# Patient Record
Sex: Male | Born: 1955 | Race: White | Hispanic: No | Smoking: Former smoker
Health system: Southern US, Community
[De-identification: ages and names within clinical notes are randomized; demographics above are authoritative.]

## PROBLEM LIST (undated history)

## (undated) DIAGNOSIS — M1990 Unspecified osteoarthritis, unspecified site: Secondary | ICD-10-CM

## (undated) DIAGNOSIS — I10 Essential (primary) hypertension: Secondary | ICD-10-CM

---

## 2015-10-02 ENCOUNTER — Emergency Department
Admission: EM | Admit: 2015-10-02 | Discharge: 2015-10-02 | Disposition: A | Discharge status: Home / Self Care | Payer: BLUE CROSS/BLUE SHIELD | Source: Home / Self Care | Attending: Emergency Medicine | Admitting: Emergency Medicine

## 2015-10-02 ENCOUNTER — Emergency Department (INDEPENDENT_AMBULATORY_CARE_PROVIDER_SITE_OTHER): Payer: BLUE CROSS/BLUE SHIELD

## 2015-10-02 ENCOUNTER — Encounter: Payer: Self-pay | Admitting: Emergency Medicine

## 2015-10-02 DIAGNOSIS — M479 Spondylosis, unspecified: Secondary | ICD-10-CM

## 2015-10-02 DIAGNOSIS — M545 Low back pain: Secondary | ICD-10-CM

## 2015-10-02 HISTORY — DX: Unspecified osteoarthritis, unspecified site: M19.90

## 2015-10-02 HISTORY — DX: Essential (primary) hypertension: I10

## 2015-10-02 MED ORDER — PREDNISONE 10 MG PO TABS: ORAL_TABLET | ORAL | Status: DC

## 2015-10-02 MED ORDER — METHOCARBAMOL 500 MG PO TABS: 500.0000 mg | ORAL_TABLET | Freq: Four times a day (QID) | ORAL | Status: DC

## 2015-10-02 MED ORDER — HYDROCODONE-ACETAMINOPHEN 5-325 MG PO TABS: 2.0000 | ORAL_TABLET | ORAL | Status: DC | PRN

## 2015-10-02 NOTE — ED Notes (Signed)
Low back pain x 2 weeks after doing heavy lifting in the yard. Pain when going from sitting to standing position. 8/10, not getting better

## 2015-10-02 NOTE — Discharge Instructions (Signed)

## 2015-10-02 NOTE — ED Provider Notes (Signed)
CSN: [id number]     Arrival date & time 10/02/15  4098 History   First MD Initiated Contact with Patient 10/02/15 1006     Chief Complaint  Patient presents with  . Back Pain   (Consider location/radiation/quality/duration/timing/severity/associated sxs/prior Treatment) Patient is a 60 y.o. male presenting with back pain. The history is provided by the patient. No language interpreter was used.  Back Pain Location:  Lumbar spine Quality:  Aching Radiates to:  Does not radiate Pain severity:  Severe Pain is:  Same all the time Onset quality:  Sudden Timing:  Constant Progression:  Worsening Chronicity:  New Context: lifting heavy objects   Relieved by:  Nothing Worsened by:  Nothing tried Ineffective treatments:  None tried Associated symptoms: no numbness and no paresthesias   Risk factors: no lack of exercise   Pt reports sevee pain when he rises from a seated position.   Past Medical History  Diagnosis Date  . Arthritis   . Hypertension    History reviewed. No pertinent past surgical history. Family History  Problem Relation Age of Onset  . Hypertension Mother   . Hypertension Father    Social History  Substance Use Topics  . Smoking status: Former Games developer  . Smokeless tobacco: None  . Alcohol Use: No    Review of Systems  Musculoskeletal: Positive for back pain.  Neurological: Negative for numbness and paresthesias.  All other systems reviewed and are negative.   Allergies  Review of patient's allergies indicates no known allergies.  Home Medications   Prior to Admission medications   Medication Sig Start Date End Date Taking? Authorizing Provider  albuterol (PROVENTIL) (2.5 MG/3ML) 0.083% nebulizer solution Take 2.5 mg by nebulization every 6 (six) hours as needed for wheezing or shortness of breath.   Yes Historical Provider, MD  amLODipine-benazepril (LOTREL) 5-10 MG capsule Take 1 capsule by mouth daily.   Yes Historical Provider, MD  doxazosin  (CARDURA) 1 MG tablet Take 1 mg by mouth daily.   Yes Historical Provider, MD   Meds Ordered and Administered this Visit  Medications - No data to display  BP 148/83 mmHg  Pulse 68  Temp(Src) 98.3 F (36.8 C) (Oral)  Ht  (1.854 m)  Wt 188 lb (85.276 kg)  BMI 24.81 kg/m2  SpO2 97% No data found.   Physical Exam  Constitutional: He is oriented to person, place, and time. He appears well-developed and well-nourished.  HENT:  Head: Normocephalic and atraumatic.  Eyes: Conjunctivae and EOM are normal.  Neck: Normal range of motion.  Cardiovascular: Normal heart sounds.   Pulmonary/Chest: Effort normal.  Abdominal: He exhibits no distension.  Musculoskeletal: He exhibits tenderness.  Tender ls spine,  Pain with standing,  nv and ns intact  Neurological: He is alert and oriented to person, place, and time.  Skin: Skin is warm.  Psychiatric: He has a normal mood and affect.  Nursing note and vitals reviewed.   ED Course  Procedures (including critical care time)  Labs Review Labs Reviewed - No data to display  Imaging Review Dg Lumbar Spine Complete  10/02/2015  CLINICAL DATA:  Mid and low back pain after working in the garden this past weekend. Initial encounter. EXAM: LUMBAR SPINE - COMPLETE 4+ VIEW COMPARISON:  None. FINDINGS: Vertebral body height and alignment are maintained. Loss of disc space height is seen at L3-4, L4-5 and L5-S1. There is lower lumbar facet degenerative disease. Aortic atherosclerosis is noted. IMPRESSION: No acute abnormality. Lower lumbar spondylosis.  Electronically Signed   By: Drusilla Kannerhomas  Dalessio M.D.   On: 10/02/2015 10:54     Visual Acuity Review  Right Eye Distance:   Left Eye Distance:   Bilateral Distance:    Right Eye Near:   Left Eye Near:    Bilateral Near:         MDM  Pt counseled on results,  I will try prednisone.  Pt referred to Dr. Denyse Amassorey if symptoms perist after prednisone tratment.   1. Low back pain without sciatica,  unspecified back pain laterality    Meds ordered this encounter  Medications  . amLODipine-benazepril (LOTREL) 5-10 MG capsule    Sig: Take 1 capsule by mouth daily.  Marland Kitchen. doxazosin (CARDURA) 1 MG tablet    Sig: Take 1 mg by mouth daily.  Marland Kitchen. albuterol (PROVENTIL) (2.5 MG/3ML) 0.083% nebulizer solution    Sig: Take 2.5 mg by nebulization every 6 (six) hours as needed for wheezing or shortness of breath.  . DISCONTD: predniSONE (DELTASONE) 10 MG tablet    Sig: 6,5,4,3,2,1 taper    Dispense:  21 tablet    Refill:  0    Order Specific Question:  Supervising Provider    AnswerGeorgina Pillion:  MASSEY, DAVID [[id number]]  . HYDROcodone-acetaminophen (NORCO/VICODIN) 5-325 MG tablet    Sig: Take 2 tablets by mouth every 4 (four) hours as needed.    Dispense:  10 tablet    Refill:  0    Order Specific Question:  Supervising Provider    Answer:  Georgina PillionMASSEY, DAVID [[id number]]  . DISCONTD: methocarbamol (ROBAXIN) 500 MG tablet    Sig: Take 1 tablet (500 mg total) by mouth 4 (four) times daily.    Dispense:  28 tablet    Refill:  0    Order Specific Question:  Supervising Provider    Answer:  Georgina PillionMASSEY, DAVID [[id number]]  . methocarbamol (ROBAXIN) 500 MG tablet    Sig: Take 1 tablet (500 mg total) by mouth 4 (four) times daily.    Dispense:  28 tablet    Refill:  0    Order Specific Question:  Supervising Provider    Answer:  Georgina PillionMASSEY, DAVID [[id number]]  . predniSONE (DELTASONE) 10 MG tablet    Sig: 6,5,4,3,2,1 taper    Dispense:  21 tablet    Refill:  0    Order Specific Question:  Supervising Provider    Answer:  Georgina PillionMASSEY, DAVID [[id number]]  An After Visit Summary was printed and given to the patient.    Lonia SkinnerLeslie K CalvertonSofia, PA-C 10/02/15 1118

## 2017-05-25 IMAGING — DX DG LUMBAR SPINE COMPLETE 4+V
5 series · 5 of 5 positions shown · non-contrast
Comparison: None.

CLINICAL DATA: Mid and low back pain after working in the garden
this past weekend. Initial encounter.

EXAM:
LUMBAR SPINE - COMPLETE 4+ VIEW

[l-spine ap]
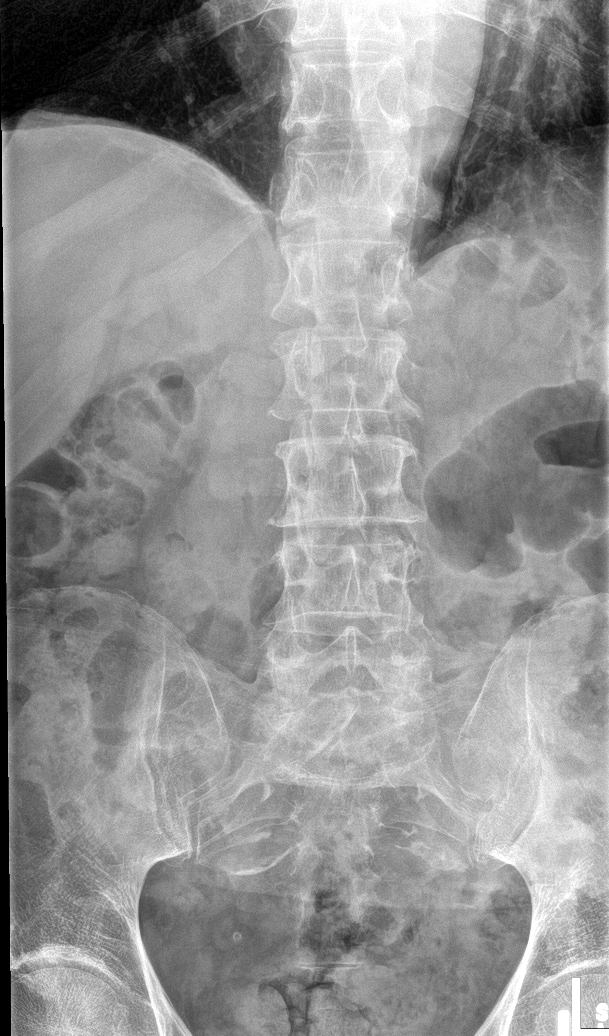

[l-spine obl (1 of 2)]
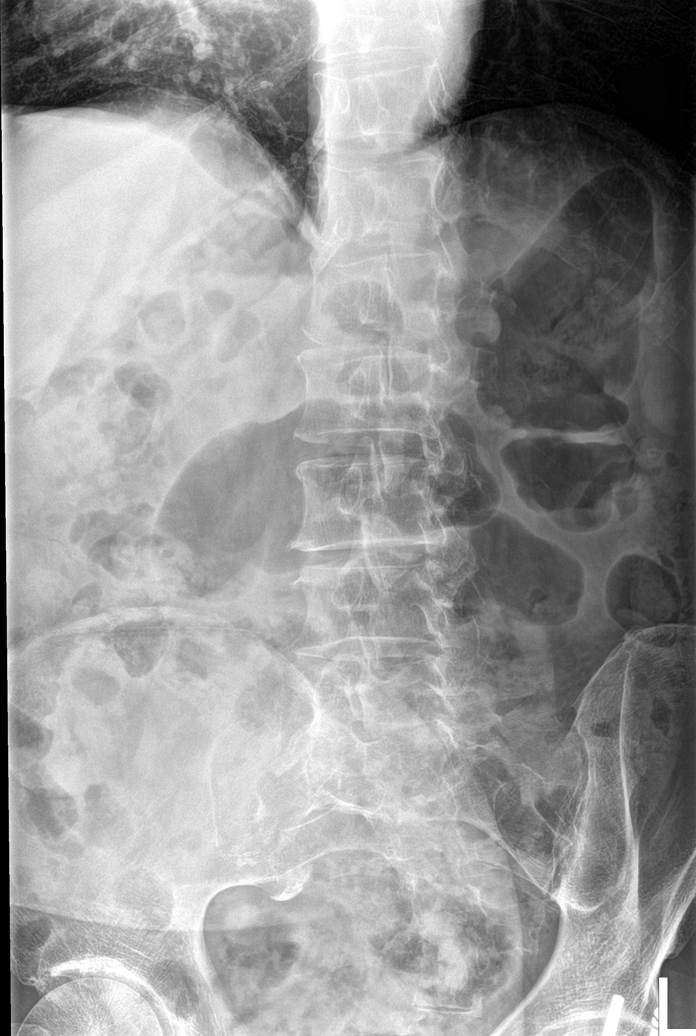

[l-spine obl (2 of 2)]
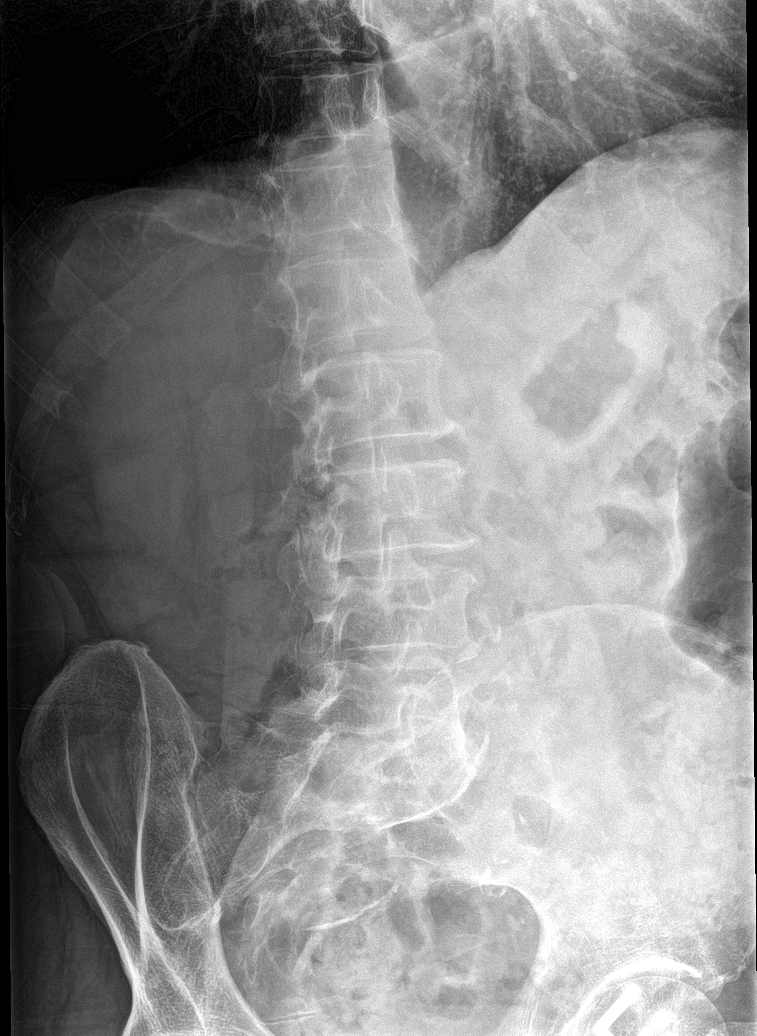

[l-spine lat]
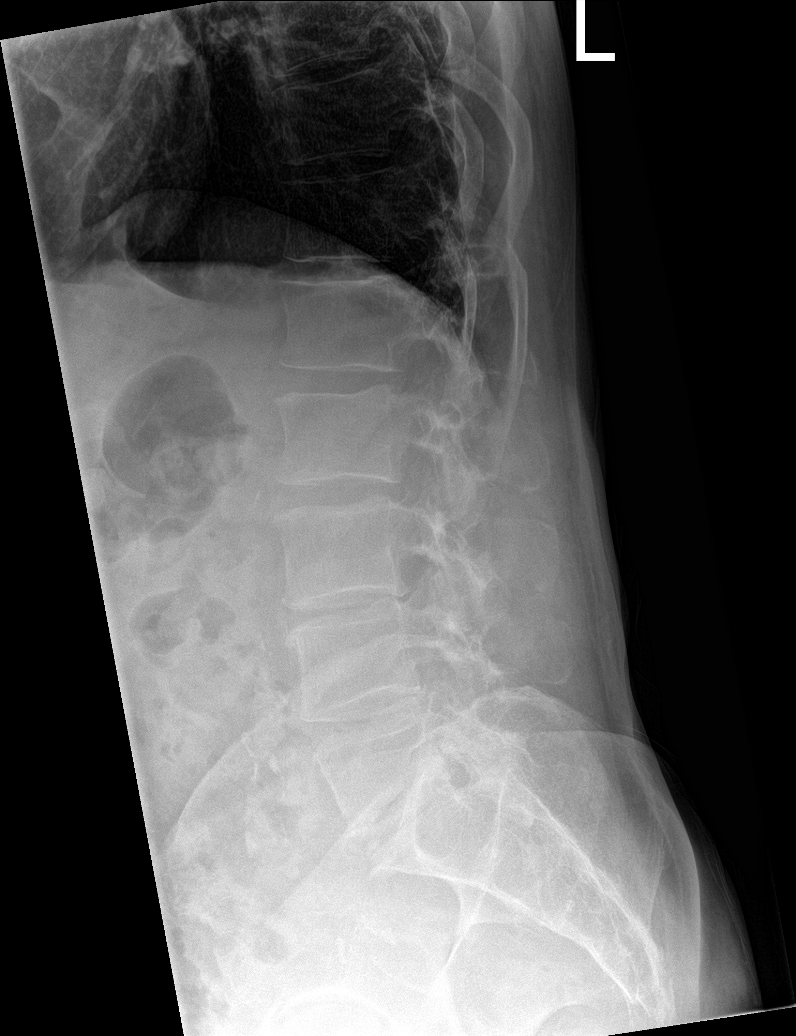

[l-spine spot]
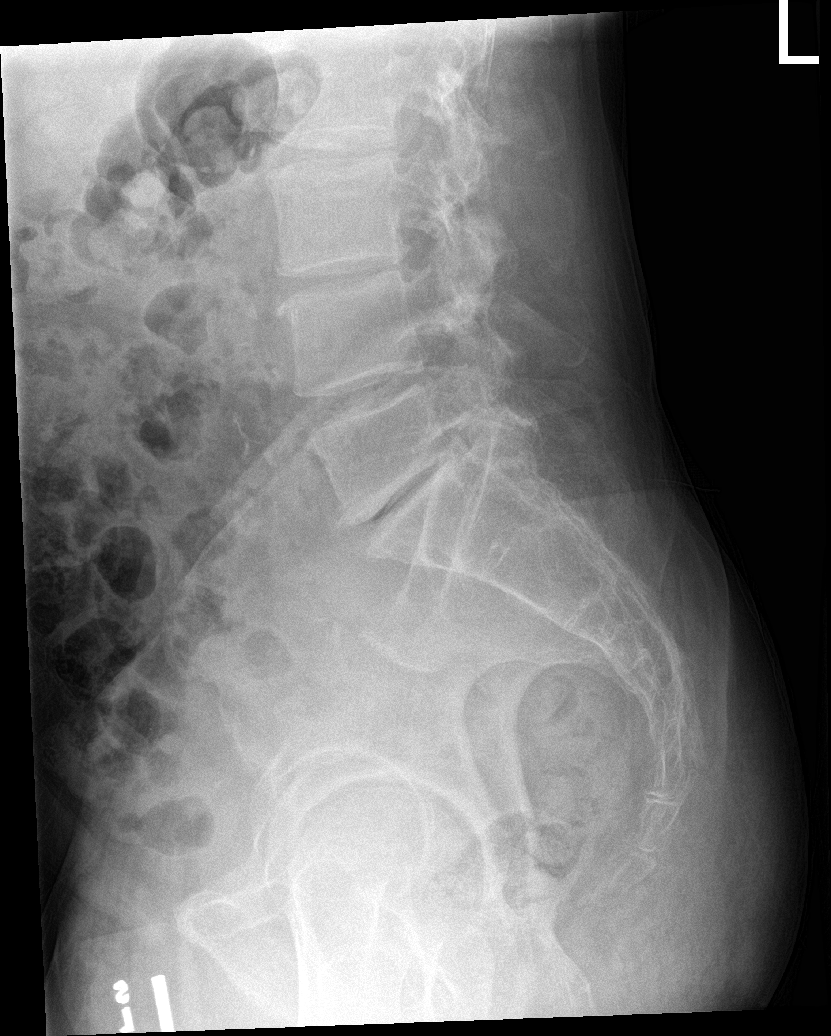

[5 of 5 positions shown; findings below may reference images not displayed]

FINDINGS: Vertebral body height and alignment are maintained. Loss of disc
space height is seen at L3-4, L4-5 and L5-S1. There is lower lumbar
facet degenerative disease. Aortic atherosclerosis is noted.
IMPRESSION: No acute abnormality.

Lower lumbar spondylosis.

## 2019-06-30 ENCOUNTER — Other Ambulatory Visit: Payer: Self-pay

## 2019-06-30 ENCOUNTER — Emergency Department
Admission: EM | Admit: 2019-06-30 | Discharge: 2019-06-30 | Disposition: A | Discharge status: Home / Self Care | Payer: BLUE CROSS/BLUE SHIELD | Source: Home / Self Care

## 2019-06-30 DIAGNOSIS — R509 Fever, unspecified: Secondary | ICD-10-CM

## 2019-06-30 DIAGNOSIS — J069 Acute upper respiratory infection, unspecified: Secondary | ICD-10-CM

## 2019-06-30 MED ORDER — AMLODIPINE BESY-BENAZEPRIL HCL 5-10 MG PO CAPS: 1.0000 | ORAL_CAPSULE | Freq: Every day | ORAL | 0 refills | Status: AC

## 2019-06-30 MED ORDER — DOXAZOSIN MESYLATE 1 MG PO TABS: 1.0000 mg | ORAL_TABLET | Freq: Every day | ORAL | 0 refills | Status: AC

## 2019-06-30 NOTE — ED Triage Notes (Addendum)
Pt c/o bodyaches, fever and sinus drainage since Monday. Works at [employer] and has been around [facility]. Had a neg rapid yesterday at work. Would like sendout today. Taking sinutabs prn. Pt is also hypertensive today. Says he hasn't seen PCP in a few years, and used to take BP meds but does not anymore.

## 2019-06-30 NOTE — ED Provider Notes (Signed)
[facility]    CSN: [id number] Arrival date & time: 06/30/19  1135      History   Chief Complaint Chief Complaint  Patient presents with  . Generalized Body Aches  . Fever  . Nasal Congestion    HPI Lonnie Webster is a 64 y.o. male.   The history is provided by the patient. No language interpreter was used.  Fever Temp source:  Subjective Severity:  Moderate Onset quality:  Gradual Timing:  Constant Progression:  Worsening Chronicity:  New Worsened by:  Nothing Ineffective treatments:  None tried Associated symptoms: cough   Risk factors: no sick contacts   Cough Associated symptoms: fever     Past Medical History:  Diagnosis Date  . Arthritis   . Hypertension     There are no problems to display for this patient.   History reviewed. No pertinent surgical history.     Home Medications    Prior to Admission medications   Medication Sig Start Date End Date Taking? Authorizing Provider  albuterol (PROVENTIL) (2.5 MG/3ML) 0.083% nebulizer solution Take 2.5 mg by nebulization every 6 (six) hours as needed for wheezing or shortness of breath.    [provider]  amLODipine-benazepril (LOTREL) 5-10 MG capsule Take 1 capsule by mouth daily. 06/30/19   Elson Areas, PA-C  doxazosin (CARDURA) 1 MG tablet Take 1 tablet (1 mg total) by mouth daily. 06/30/19   Elson Areas, PA-C    Family History Family History  Problem Relation Age of Onset  . Hypertension Mother   . Hypertension Father     Social History Social History   Tobacco Use  . Smoking status: Former Games developer  . Smokeless tobacco: Never Used  Substance Use Topics  . Alcohol use: No  . Drug use: Not on file     Allergies   Patient has no known allergies.   Review of Systems Review of Systems  Constitutional: Positive for fever.  Respiratory: Positive for cough.   All other systems reviewed and are negative.    Physical Exam Triage Vital Signs ED Triage  Vitals  Enc Vitals Group     BP 06/30/19 1156 (!) 163/111     Pulse Rate 06/30/19 1156 95     Resp 06/30/19 1156 18     Temp 06/30/19 1156 98.7 F (37.1 C)     Temp Source 06/30/19 1156 Oral     SpO2 06/30/19 1156 97 %     Weight 06/30/19 1157 195 lb (88.5 kg)     Height 06/30/19 1157 6' (1.829 m)     Head Circumference --      Peak Flow --      Pain Score 06/30/19 1157 0     Pain Loc --      Pain Edu? --      Excl. in GC? --    No data found.  Updated Vital Signs BP (!) 163/111 (BP Location: Left Arm)   Pulse 95   Temp 98.7 F (37.1 C) (Oral)   Resp 18   Ht 6' (1.829 m)   Wt 88.5 kg   SpO2 97%   BMI 26.45 kg/m   Visual Acuity Right Eye Distance:   Left Eye Distance:   Bilateral Distance:    Right Eye Near:   Left Eye Near:    Bilateral Near:     Physical Exam Vitals and nursing note reviewed.  Constitutional:      Appearance: He is well-developed.  HENT:  Head: Normocephalic and atraumatic.  Eyes:     Conjunctiva/sclera: Conjunctivae normal.  Cardiovascular:     Rate and Rhythm: Normal rate and regular rhythm.     Heart sounds: No murmur.  Pulmonary:     Effort: Pulmonary effort is normal. No respiratory distress.     Breath sounds: Normal breath sounds.  Abdominal:     Palpations: Abdomen is soft.     Tenderness: There is no abdominal tenderness.  Musculoskeletal:     Cervical back: Neck supple.  Skin:    General: Skin is warm and dry.  Neurological:     General: No focal deficit present.     Mental Status: He is alert.  Psychiatric:        Mood and Affect: Mood normal.      UC Treatments / Results  Labs (all labs ordered are listed, but only abnormal results are displayed) Labs Reviewed  NOVEL CORONAVIRUS, NAA    EKG   Radiology No results found.  Procedures Procedures (including critical care time)  Medications Ordered in UC Medications - No data to display  Initial Impression / Assessment and Plan / UC Course  I have  reviewed the triage vital signs and the nursing notes.  Pertinent labs & imaging results that were available during my care of the patient were reviewed by me and considered in my medical decision making (see chart for details).     MDM  Pt has had multiple covid exposures.  Pt had a negative rapid test.  Pt began feeling bad on Monday.  He is worried about sinus infection.  Pt's blood pressure is high.  He is out of mediations. Pt given rx for medications. covid test ordered.  Pt advised he should quarantine due to risk.  I do not think pt needs treatment for sinus infection at this time  Final Clinical Impressions(s) / UC Diagnoses   Final diagnoses:  Fever, unspecified  Upper respiratory tract infection, unspecified type     Discharge Instructions     Your covid test is pending. See your Physician for recheck of your blood pressure.    ED Prescriptions    Medication Sig Dispense Auth. Provider   doxazosin (CARDURA) 1 MG tablet Take 1 tablet (1 mg total) by mouth daily. 30 tablet Kimbria Camposano K, [place]   amLODipine-benazepril (LOTREL) 5-10 MG capsule Take 1 capsule by mouth daily. 30 capsule Fransico Meadow, [place]     PDMP not reviewed this encounter.   An After Visit Summary was printed and given to the patient.    Fransico Meadow, [place] 06/30/19 1408

## 2019-06-30 NOTE — Discharge Instructions (Signed)
Your covid test is pending. See your Physician for recheck of your blood pressure.

## 2019-07-02 LAB — NOVEL CORONAVIRUS, NAA: SARS-CoV-2, NAA: NOT DETECTED

## 2023-11-13 ENCOUNTER — Ambulatory Visit: Admission: EM | Admit: 2023-11-13 | Discharge: 2023-11-13 | Disposition: A | Discharge status: Home / Self Care

## 2023-11-13 ENCOUNTER — Other Ambulatory Visit: Payer: Self-pay

## 2023-11-13 DIAGNOSIS — R0602 Shortness of breath: Secondary | ICD-10-CM

## 2023-11-13 DIAGNOSIS — R0601 Orthopnea: Secondary | ICD-10-CM | POA: Diagnosis not present

## 2023-11-13 DIAGNOSIS — I160 Hypertensive urgency: Secondary | ICD-10-CM

## 2023-11-13 NOTE — Discharge Instructions (Signed)
Please go directly to the emergency room for further evaluation and management of your symptoms. 

## 2023-11-13 NOTE — ED Provider Notes (Signed)
 [facility]    CSN: [id number] Arrival date & time: 11/13/23  1006      History   Chief Complaint Chief Complaint  Patient presents with   Hypertension   Shortness of Breath    HPI Lonnie Webster is a 68 y.o. male.   Patient presents today with 8-day history of worsening shortness of breath, orthopnea, dyspnea with exertion.  Reports since that time, he began checking his blood pressure more frequently at home and has been noticing blood pressure in the 190s over 1 teens.  Reports years ago, he was prescribed blood pressure medication, but did not like how it made him feel so he stopped taking it.  He is currently in between primary care providers.  He denies chest pain, pressure, or tightness in urgent care today.  No lightheadedness or dizziness or swelling in lower extremities.  Reports his abdomen does not feel distended.  He works as a Electrical engineer at [employer] and has noticed worsening shortness of breath with his normal rounds.    Past Medical History:  Diagnosis Date   Arthritis    Hypertension     There are no active problems to display for this patient.   History reviewed. No pertinent surgical history.     Home Medications    Prior to Admission medications   Medication Sig Start Date End Date Taking? Authorizing Provider  cetirizine (ZYRTEC) 10 MG tablet Take 10 mg by mouth daily.   Yes [provider]  lactobacillus acidophilus (BACID) TABS tablet Take 2 tablets by mouth 3 (three) times daily.   Yes [provider]  albuterol (PROVENTIL) (2.5 MG/3ML) 0.083% nebulizer solution Take 2.5 mg by nebulization every 6 (six) hours as needed for wheezing or shortness of breath.    [provider]  amLODipine -benazepril  (LOTREL) 5-10 MG capsule Take 1 capsule by mouth daily. 06/30/19   Sofia, Leslie K, PA-C  doxazosin  (CARDURA ) 1 MG tablet Take 1 tablet (1 mg total) by mouth daily. 06/30/19   Sandi Crosby, PA-C     Family History Family History  Problem Relation Age of Onset   Hypertension Mother    Hypertension Father     Social History Social History   Tobacco Use   Smoking status: Former   Smokeless tobacco: Never  Vaping Use   Vaping status: Never Used  Substance Use Topics   Alcohol use: No     Allergies   Patient has no known allergies.   Review of Systems Review of Systems Per HPI  Physical Exam Triage Vital Signs ED Triage Vitals  Encounter Vitals Group     BP 11/13/23 1024 (!) 198/112     Girls Systolic BP Percentile --      Girls Diastolic BP Percentile --      Boys Systolic BP Percentile --      Boys Diastolic BP Percentile --      Pulse Rate 11/13/23 1024 77     Resp 11/13/23 1024 16     Temp 11/13/23 1024 98.4 F (36.9 C)     Temp src --      SpO2 11/13/23 1024 96 %     Weight --      Height --      Head Circumference --      Peak Flow --      Pain Score 11/13/23 1029 0     Pain Loc --      Pain Education --  Exclude from Growth Chart --    No data found.  Updated Vital Signs BP (!) 198/112   Pulse 77   Temp 98.4 F (36.9 C)   Resp 16   SpO2 96%   Visual Acuity Right Eye Distance:   Left Eye Distance:   Bilateral Distance:    Right Eye Near:   Left Eye Near:    Bilateral Near:     Physical Exam Vitals and nursing note reviewed.  Constitutional:      General: He is not in acute distress.    Appearance: He is well-developed. He is not toxic-appearing.  HENT:     Head: Normocephalic and atraumatic.     Mouth/Throat:     Mouth: Mucous membranes are moist.     Pharynx: Oropharynx is clear.   Cardiovascular:     Rate and Rhythm: Normal rate and regular rhythm.  Pulmonary:     Effort: Pulmonary effort is normal. No tachypnea.     Breath sounds: Decreased breath sounds and rales present.   Musculoskeletal:     Cervical back: Normal range of motion and neck supple.     Right lower leg: No tenderness. No edema.     Left  lower leg: No tenderness. No edema.  Lymphadenopathy:     Cervical: No cervical adenopathy.   Skin:    Capillary Refill: Capillary refill takes less than 2 seconds.   Neurological:     Mental Status: He is alert and oriented to person, place, and time.   Psychiatric:        Behavior: Behavior is cooperative.      UC Treatments / Results  Labs (all labs ordered are listed, but only abnormal results are displayed) Labs Reviewed - No data to display  EKG   Radiology No results found.  Procedures Procedures (including critical care time)  Medications Ordered in UC Medications - No data to display  Initial Impression / Assessment and Plan / UC Course  I have reviewed the triage vital signs and the nursing notes.  Pertinent labs & imaging results that were available during my care of the patient were reviewed by me and considered in my medical decision making (see chart for details).   In triage, patient is hypertensive, otherwise vital signs are stable.  1. Hypertensive urgency 2. Orthopnea 3. Exertional shortness of breath Given symptoms, rales on exam, elevated blood pressure I recommended further evaluation and management in the emergency room to rule out emergent causes such as acute CHF Patient in agreement to plan and is safe to transport via private vehicle at this time  The patient was given the opportunity to ask questions.  All questions answered to their satisfaction.  The patient is in agreement to this plan.   Final Clinical Impressions(s) / UC Diagnoses   Final diagnoses:  Hypertensive urgency  Orthopnea  Exertional shortness of breath     Discharge Instructions      Please go directly to the emergency room for further evaluation and management of your symptoms.   ED Prescriptions   None    PDMP not reviewed this encounter.   Wilhemena Harbour, NP 11/13/23 1050

## 2023-11-13 NOTE — ED Triage Notes (Signed)
 Hx hypertension. Used to be on blood pressure medication 5 years ago but has not been. Last Wednesday morning woke up with sob. Sob with minimal exertion and orthopnea. Has been hypertensive. No swelling in legs per pt. No fever.

## 2023-11-13 NOTE — ED Notes (Signed)
 Patient is being discharged from the Urgent Care and sent to the Emergency Department via pov . Per Thena Fireman np, patient is in need of higher level of care due to need for further evaluation. Patient is aware and verbalizes understanding of plan of care.  Vitals:   11/13/23 1024  BP: (!) 198/112  Pulse: 77  Resp: 16  Temp: 98.4 F (36.9 C)  SpO2: 96%
